# Patient Record
Sex: Male | Born: 2000 | Race: White | Hispanic: No | Marital: Single | Smoking: Never smoker
Health system: Southern US, Community
[De-identification: ages and names within clinical notes are randomized; demographics above are authoritative.]

## PROBLEM LIST (undated history)

## (undated) DIAGNOSIS — IMO0001 Reserved for inherently not codable concepts without codable children: Secondary | ICD-10-CM

## (undated) DIAGNOSIS — K219 Gastro-esophageal reflux disease without esophagitis: Secondary | ICD-10-CM

---
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
## 2023-10-02 DIAGNOSIS — F4011 Social phobia, generalized: Secondary | ICD-10-CM | POA: Diagnosis not present

## 2023-10-02 DIAGNOSIS — F332 Major depressive disorder, recurrent severe without psychotic features: Secondary | ICD-10-CM | POA: Diagnosis not present

## 2023-10-09 DIAGNOSIS — F332 Major depressive disorder, recurrent severe without psychotic features: Secondary | ICD-10-CM | POA: Diagnosis not present

## 2023-10-09 DIAGNOSIS — F4011 Social phobia, generalized: Secondary | ICD-10-CM | POA: Diagnosis not present

## 2023-10-16 DIAGNOSIS — F4011 Social phobia, generalized: Secondary | ICD-10-CM | POA: Diagnosis not present

## 2023-10-16 DIAGNOSIS — F332 Major depressive disorder, recurrent severe without psychotic features: Secondary | ICD-10-CM | POA: Diagnosis not present

## 2023-10-23 DIAGNOSIS — F4011 Social phobia, generalized: Secondary | ICD-10-CM | POA: Diagnosis not present

## 2023-10-23 DIAGNOSIS — F332 Major depressive disorder, recurrent severe without psychotic features: Secondary | ICD-10-CM | POA: Diagnosis not present

## 2023-10-30 DIAGNOSIS — F4011 Social phobia, generalized: Secondary | ICD-10-CM | POA: Diagnosis not present

## 2023-10-30 DIAGNOSIS — F332 Major depressive disorder, recurrent severe without psychotic features: Secondary | ICD-10-CM | POA: Diagnosis not present

## 2023-11-20 DIAGNOSIS — F332 Major depressive disorder, recurrent severe without psychotic features: Secondary | ICD-10-CM | POA: Diagnosis not present

## 2023-11-20 DIAGNOSIS — F4011 Social phobia, generalized: Secondary | ICD-10-CM | POA: Diagnosis not present

## 2023-11-27 DIAGNOSIS — F4011 Social phobia, generalized: Secondary | ICD-10-CM | POA: Diagnosis not present

## 2023-11-27 DIAGNOSIS — F332 Major depressive disorder, recurrent severe without psychotic features: Secondary | ICD-10-CM | POA: Diagnosis not present

## 2023-12-04 DIAGNOSIS — F411 Generalized anxiety disorder: Secondary | ICD-10-CM | POA: Diagnosis not present

## 2023-12-04 DIAGNOSIS — F331 Major depressive disorder, recurrent, moderate: Secondary | ICD-10-CM | POA: Diagnosis not present

## 2023-12-04 DIAGNOSIS — F4011 Social phobia, generalized: Secondary | ICD-10-CM | POA: Diagnosis not present

## 2023-12-04 DIAGNOSIS — F332 Major depressive disorder, recurrent severe without psychotic features: Secondary | ICD-10-CM | POA: Diagnosis not present

## 2023-12-11 DIAGNOSIS — F332 Major depressive disorder, recurrent severe without psychotic features: Secondary | ICD-10-CM | POA: Diagnosis not present

## 2023-12-11 DIAGNOSIS — F4011 Social phobia, generalized: Secondary | ICD-10-CM | POA: Diagnosis not present

## 2023-12-18 DIAGNOSIS — F4011 Social phobia, generalized: Secondary | ICD-10-CM | POA: Diagnosis not present

## 2023-12-18 DIAGNOSIS — F332 Major depressive disorder, recurrent severe without psychotic features: Secondary | ICD-10-CM | POA: Diagnosis not present

## 2023-12-25 DIAGNOSIS — F4011 Social phobia, generalized: Secondary | ICD-10-CM | POA: Diagnosis not present

## 2023-12-25 DIAGNOSIS — F332 Major depressive disorder, recurrent severe without psychotic features: Secondary | ICD-10-CM | POA: Diagnosis not present

## 2024-01-01 DIAGNOSIS — F4011 Social phobia, generalized: Secondary | ICD-10-CM | POA: Diagnosis not present

## 2024-01-01 DIAGNOSIS — F332 Major depressive disorder, recurrent severe without psychotic features: Secondary | ICD-10-CM | POA: Diagnosis not present

## 2024-01-08 DIAGNOSIS — F332 Major depressive disorder, recurrent severe without psychotic features: Secondary | ICD-10-CM | POA: Diagnosis not present

## 2024-01-08 DIAGNOSIS — F4011 Social phobia, generalized: Secondary | ICD-10-CM | POA: Diagnosis not present

## 2024-01-15 DIAGNOSIS — F332 Major depressive disorder, recurrent severe without psychotic features: Secondary | ICD-10-CM | POA: Diagnosis not present

## 2024-01-15 DIAGNOSIS — F4011 Social phobia, generalized: Secondary | ICD-10-CM | POA: Diagnosis not present

## 2024-01-22 DIAGNOSIS — F332 Major depressive disorder, recurrent severe without psychotic features: Secondary | ICD-10-CM | POA: Diagnosis not present

## 2024-01-22 DIAGNOSIS — F4011 Social phobia, generalized: Secondary | ICD-10-CM | POA: Diagnosis not present

## 2024-02-12 DIAGNOSIS — F4011 Social phobia, generalized: Secondary | ICD-10-CM | POA: Diagnosis not present

## 2024-02-12 DIAGNOSIS — F332 Major depressive disorder, recurrent severe without psychotic features: Secondary | ICD-10-CM | POA: Diagnosis not present

## 2024-02-19 DIAGNOSIS — F332 Major depressive disorder, recurrent severe without psychotic features: Secondary | ICD-10-CM | POA: Diagnosis not present

## 2024-02-19 DIAGNOSIS — F4011 Social phobia, generalized: Secondary | ICD-10-CM | POA: Diagnosis not present

## 2024-02-26 DIAGNOSIS — F4011 Social phobia, generalized: Secondary | ICD-10-CM | POA: Diagnosis not present

## 2024-02-26 DIAGNOSIS — F332 Major depressive disorder, recurrent severe without psychotic features: Secondary | ICD-10-CM | POA: Diagnosis not present

## 2024-03-04 DIAGNOSIS — F332 Major depressive disorder, recurrent severe without psychotic features: Secondary | ICD-10-CM | POA: Diagnosis not present

## 2024-03-04 DIAGNOSIS — F4011 Social phobia, generalized: Secondary | ICD-10-CM | POA: Diagnosis not present

## 2024-03-18 DIAGNOSIS — F332 Major depressive disorder, recurrent severe without psychotic features: Secondary | ICD-10-CM | POA: Diagnosis not present

## 2024-03-18 DIAGNOSIS — F4011 Social phobia, generalized: Secondary | ICD-10-CM | POA: Diagnosis not present
